# Patient Record
Sex: Female | Born: 1985
Health system: Southern US, Community
[De-identification: ages and names within clinical notes are randomized; demographics above are authoritative.]

---

## 2020-03-09 IMAGING — US US ABDOMEN LIMITED
1 series · 14 of 25 positions shown · non-contrast
Comparison: No pertinent prior exams available for comparison.

CLINICAL DATA: Abdominal pain, right upper quadrant. Additional
history provided by scanning technologist: Patient reports right
upper quadrant abdominal pain for 4 months.

EXAM:
ULTRASOUND ABDOMEN LIMITED RIGHT UPPER QUADRANT

[Series 1: us abdomen limited · 0.25mm/px · 14 of 46 slices shown]
[im 1/46]
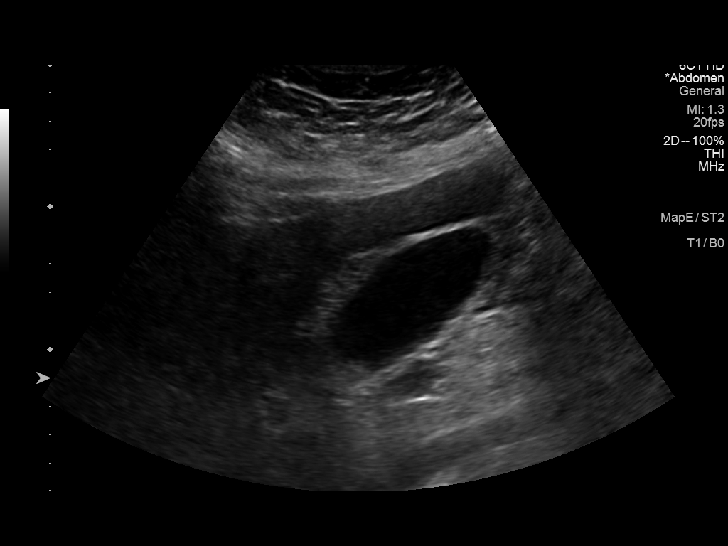
[im 4/46]
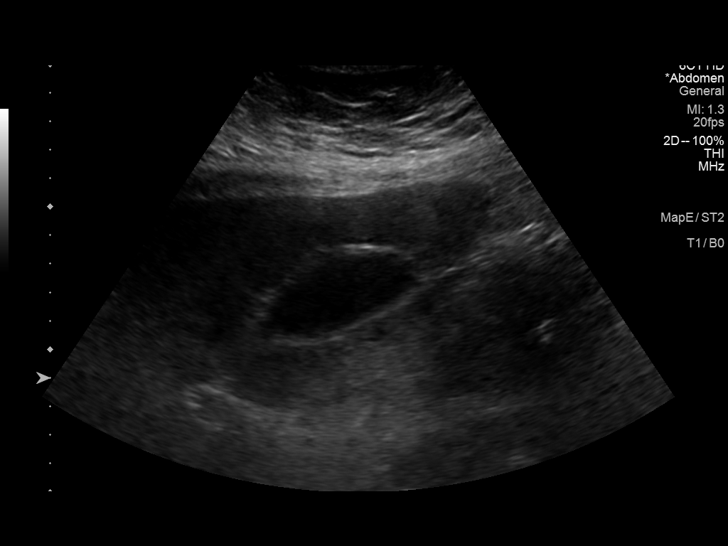
[im 8/46]
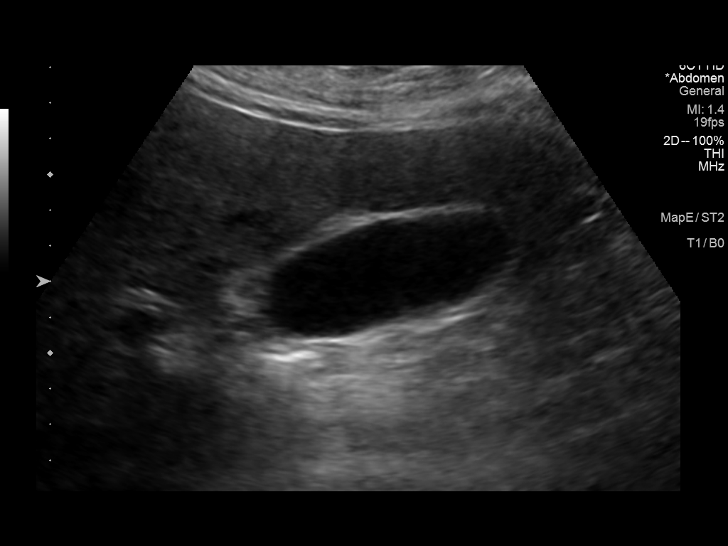
[im 12/46]
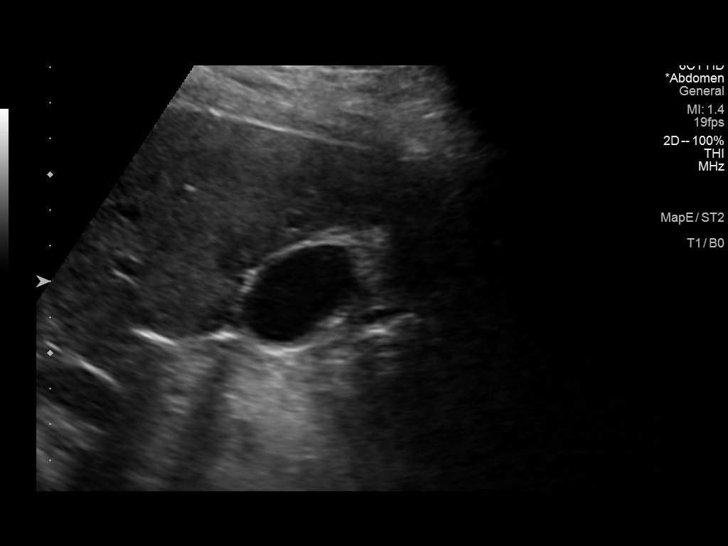
[im 16/46]
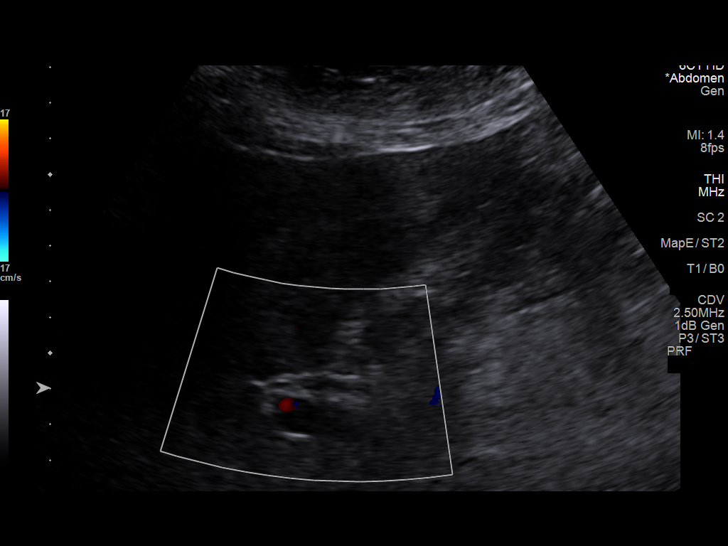
[im 17/46]
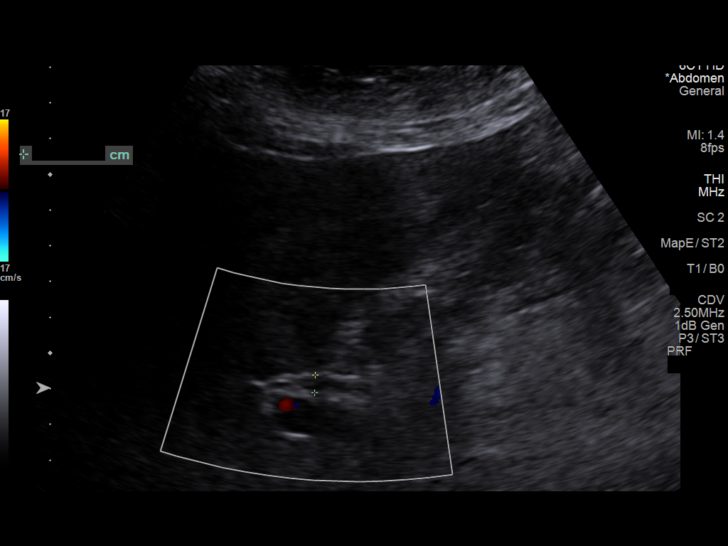
[im 21/46]
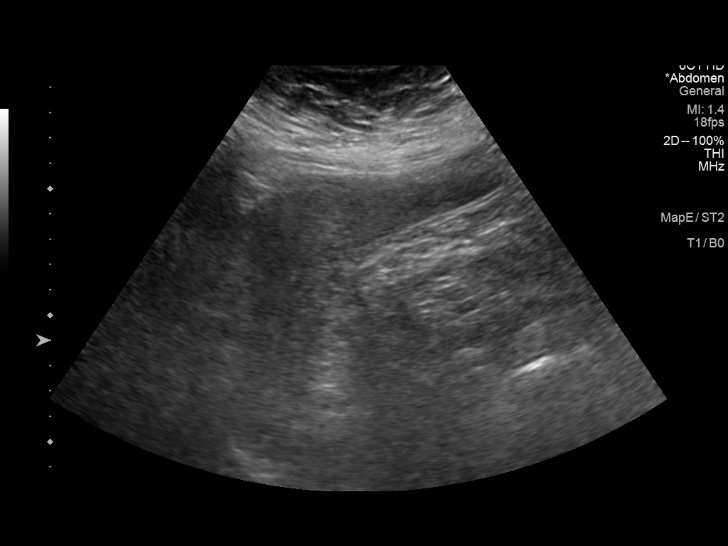
[im 25/46]
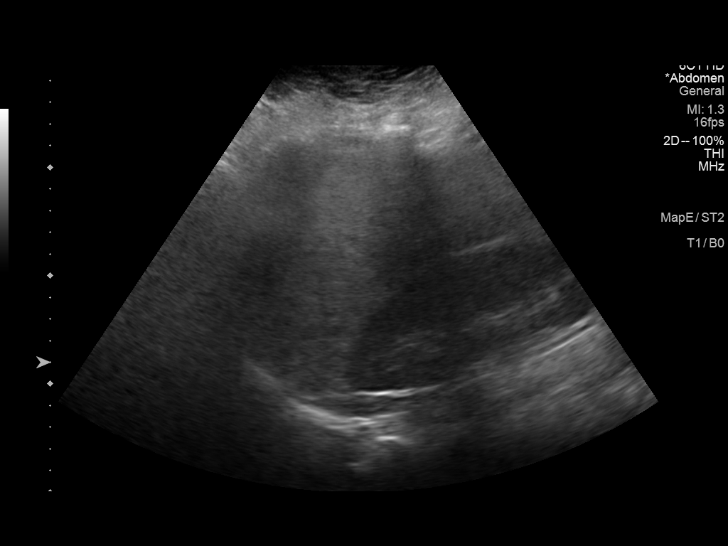
[im 29/46]
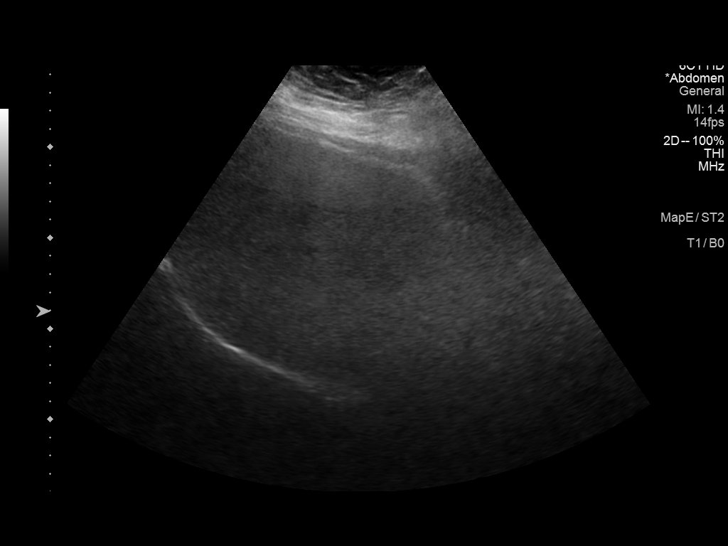
[im 31/46]
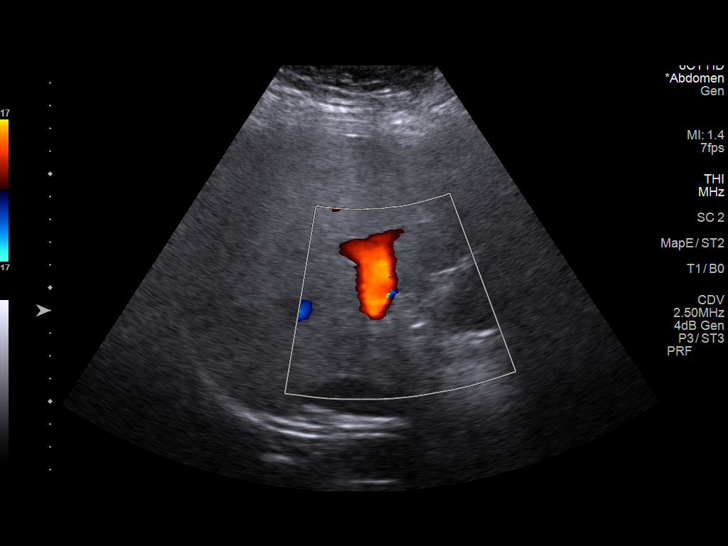
[im 34/46]
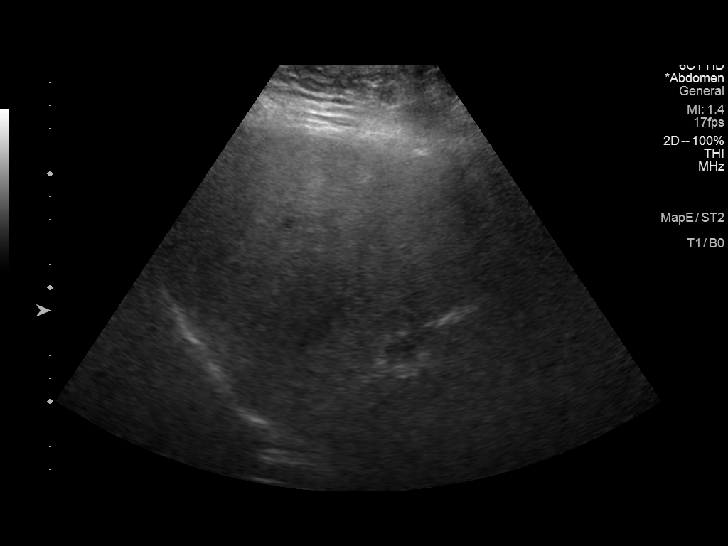
[im 38/46]
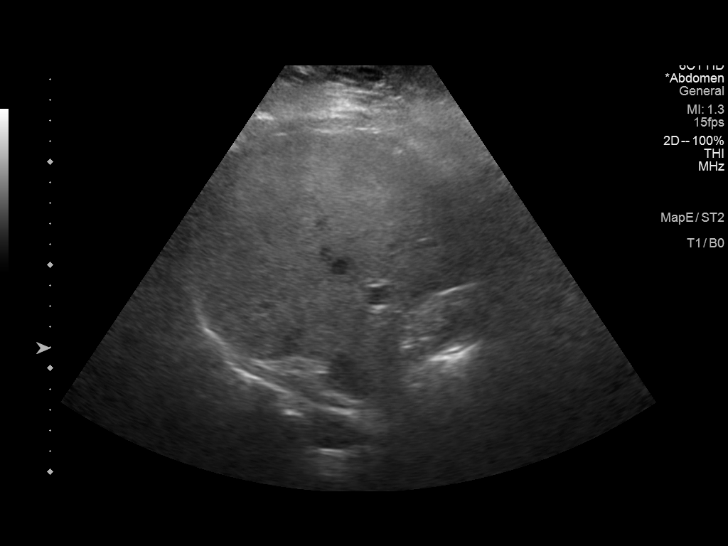
[im 42/46]
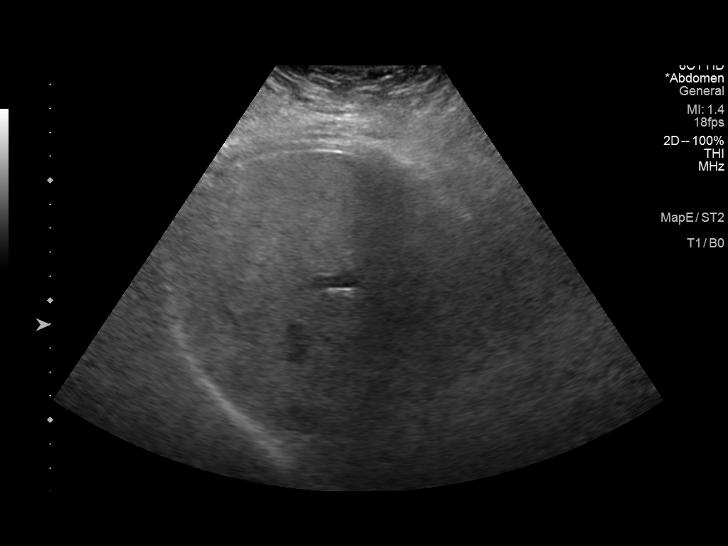
[im 46/46]
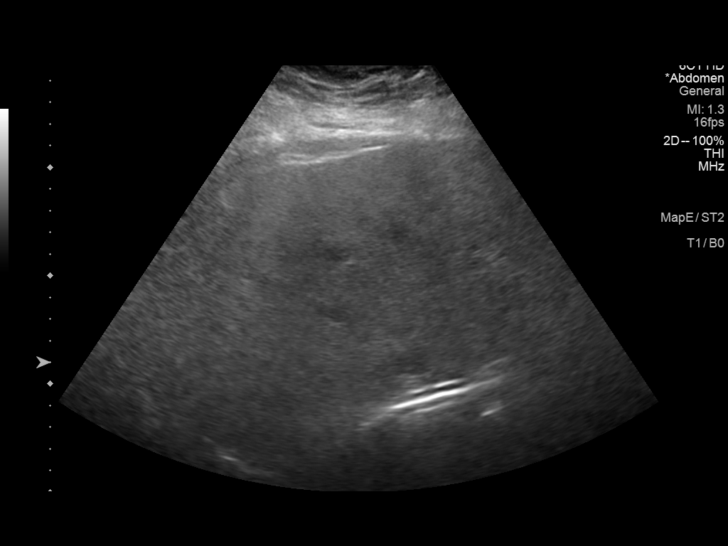

[14 of 25 positions shown; findings below may reference images not displayed]

FINDINGS: Gallbladder:

No gallstones or wall thickening visualized. No sonographic Murphy
sign noted by sonographer.

Common bile duct:

Diameter: 5 mm, within normal limits.

Liver:

No focal lesion identified. Increased hepatic parenchymal
echogenicity. Portal vein is patent on color Doppler imaging with
normal direction of blood flow towards the liver.
IMPRESSION: Hyperechogenicity of the hepatic parenchyma. This is a nonspecific
finding, which may be seen in the setting of hepatic steatosis or
other chronic hepatic parenchymal disease.

Otherwise unremarkable right upper quadrant ultrasound, as
described.

## 2020-03-17 IMAGING — CT CT ABDOMEN W/ CM
2 of 4 series · 16 of 46 positions shown, 18 images · IV contrast (omnipaque)
Comparison: None.

CLINICAL DATA: Six-month history mid to right upper quadrant
abdominal pain.

EXAM:
CT ABDOMEN WITH CONTRAST
TECHNIQUE: Multidetector CT imaging of the abdomen was performed using the
standard protocol following bolus administration of intravenous
contrast.
CONTRAST:  100mL OMNIPAQUE IOHEXOL 300 MG/ML  SOLN

[Series 2: axial st · axial · 0.77mm/px · z∈[-316,-46]mm · 13 of 60 slices shown, 15 images]
[im 3/60  soft-tissue]
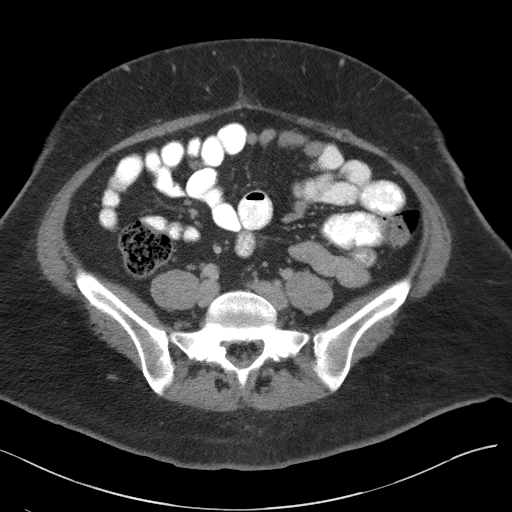
[im 3/60  bone]
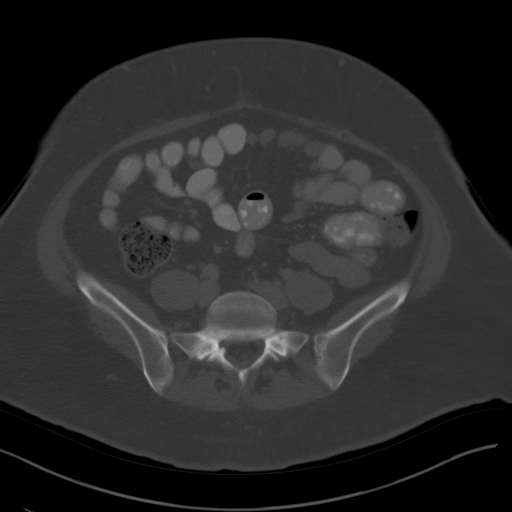
[im 9/60  soft-tissue]
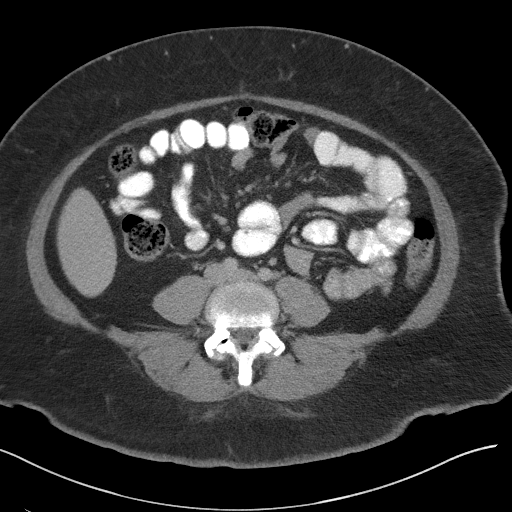
[im 12/60  soft-tissue]
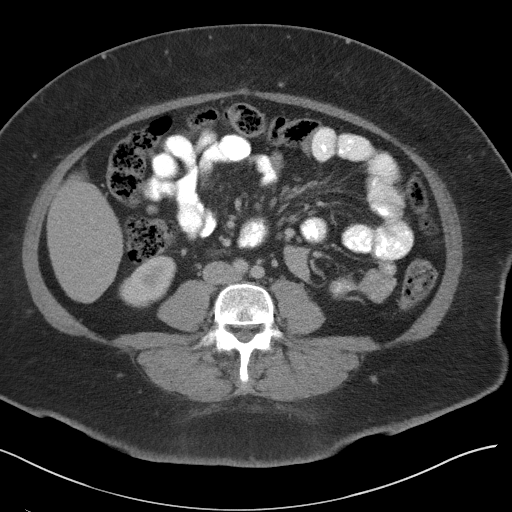
[im 17/60  soft-tissue]
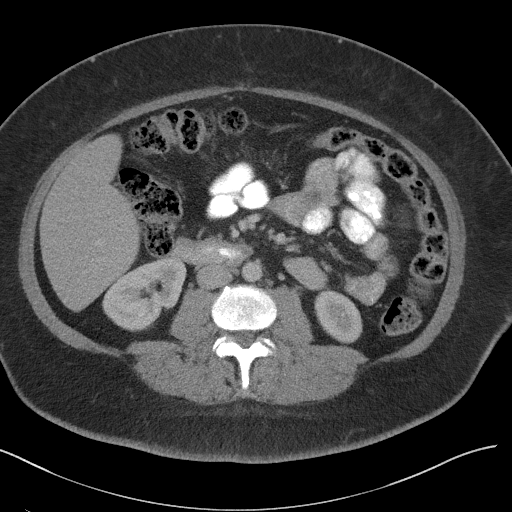
[im 20/60  soft-tissue]
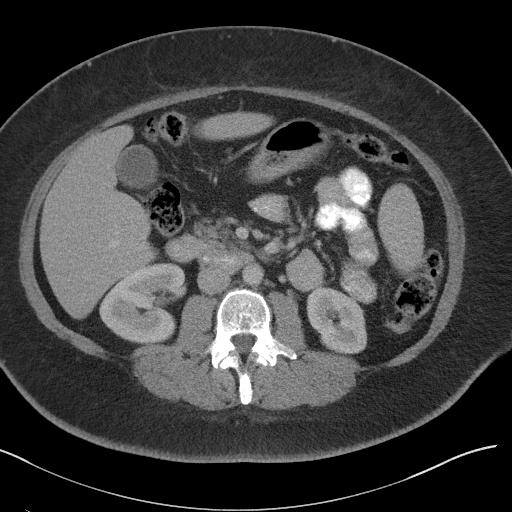
[im 26/60  soft-tissue]
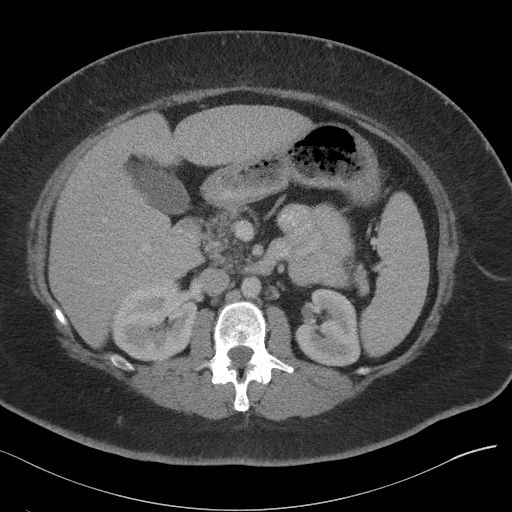
[im 31/60  soft-tissue]
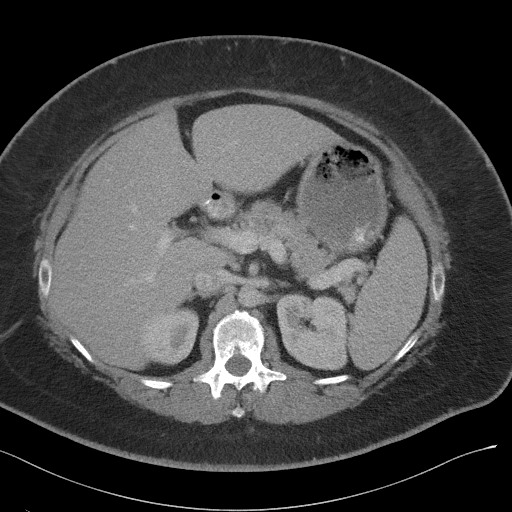
[im 34/60  soft-tissue]
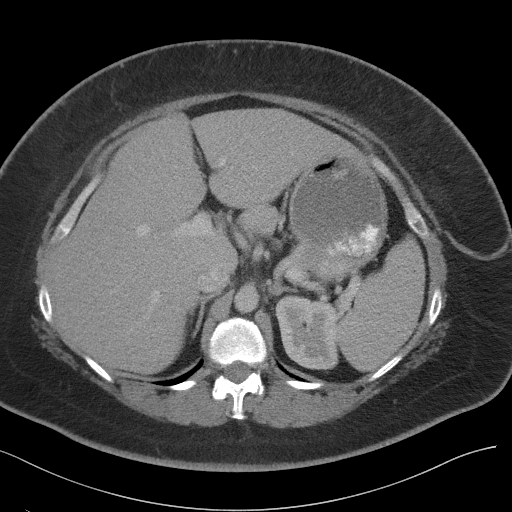
[im 40/60  soft-tissue]
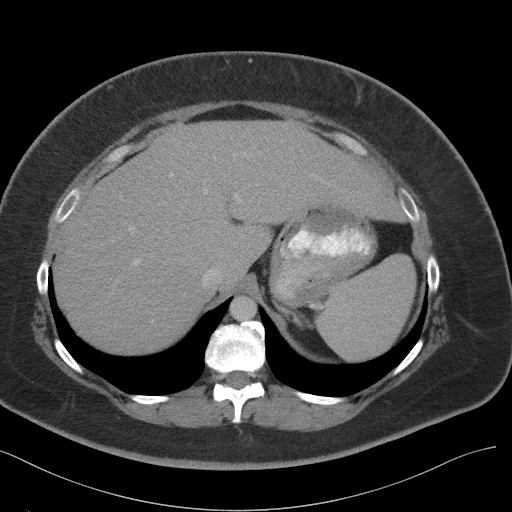
[im 40/60  bone]
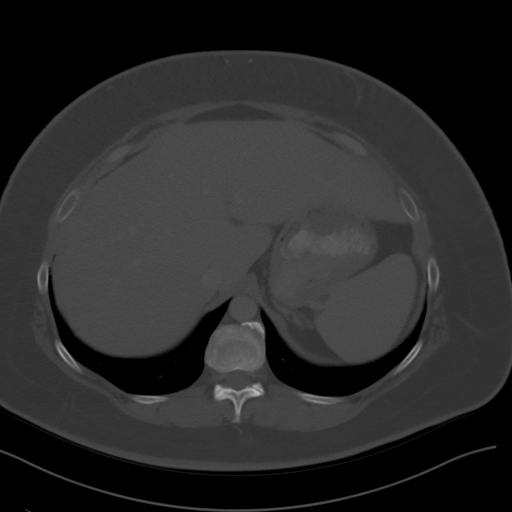
[im 43/60  soft-tissue]
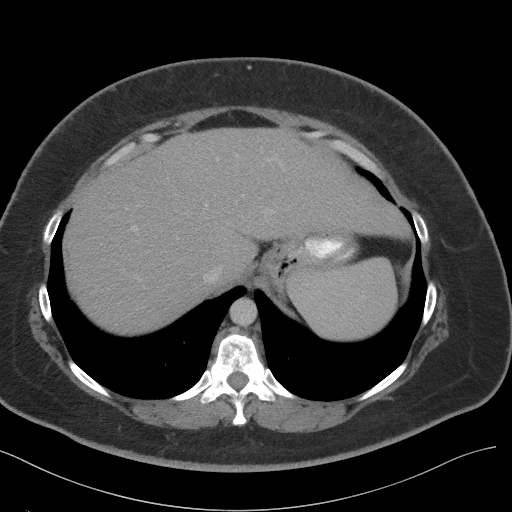
[im 48/60  soft-tissue]
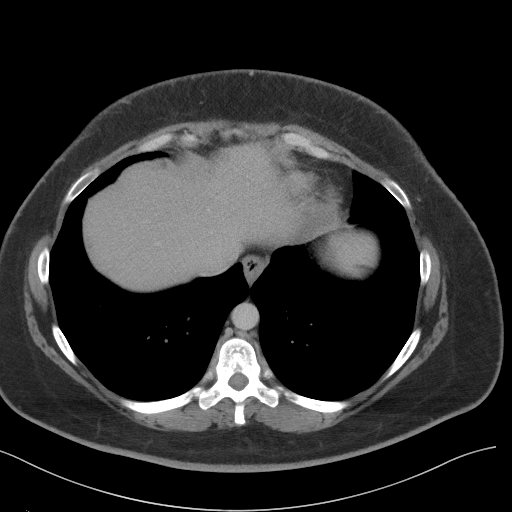
[im 51/60  soft-tissue]
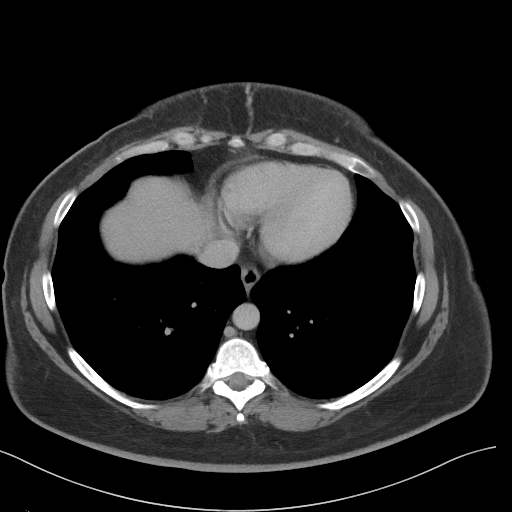
[im 57/60  soft-tissue]
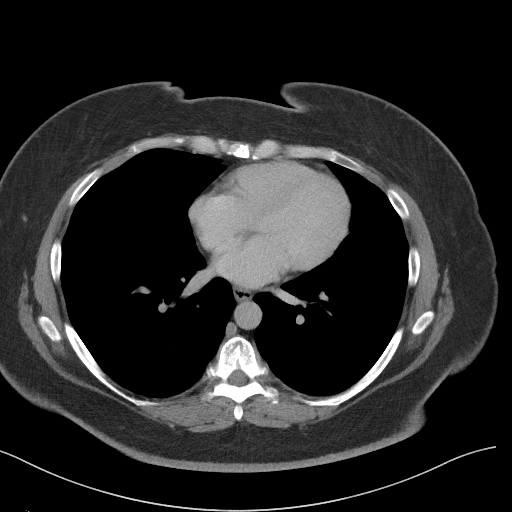

[Series 5: coronal st · coronal · 0.62mm/px · 3 of 109 slices shown]
[im 37/109  soft-tissue]
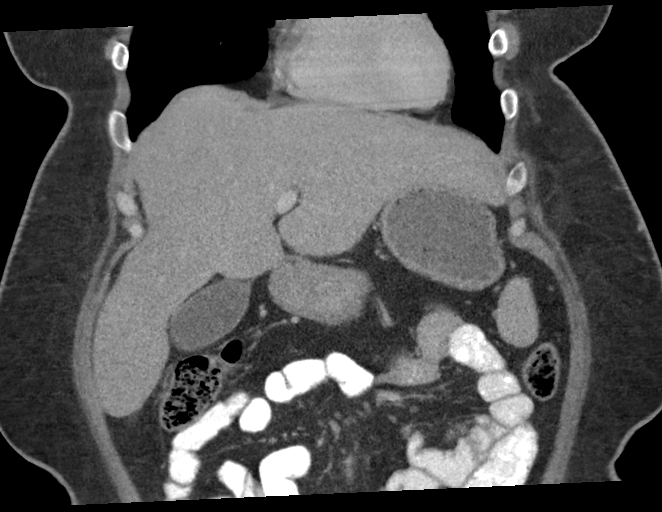
[im 49/109  soft-tissue]
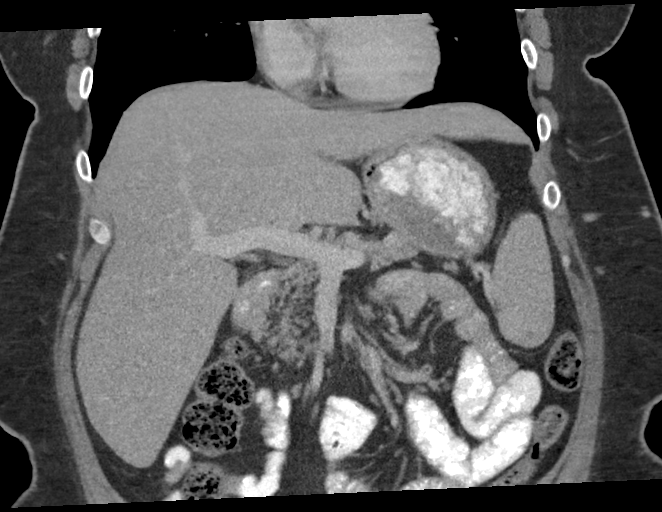
[im 61/109  soft-tissue]
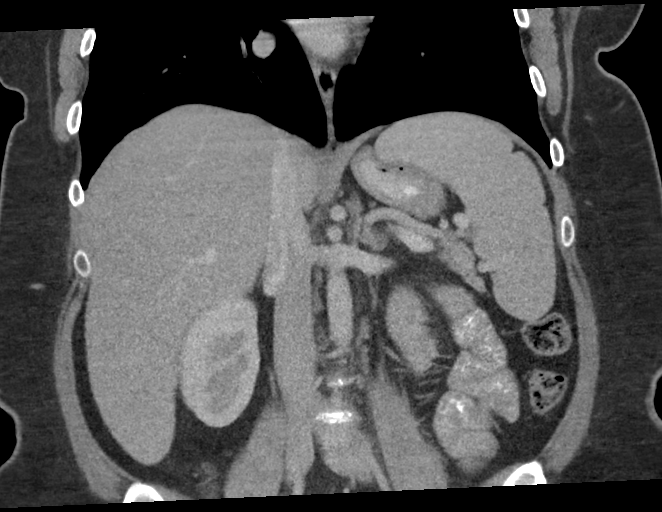

[16 of 46 positions shown; findings below may reference images not displayed]

FINDINGS: Lower chest: The lung bases are clear of acute process. No pleural
effusion or pulmonary lesions. The heart is normal in size. No
pericardial effusion. The distal esophagus and aorta are
unremarkable.

Hepatobiliary: No hepatic lesions or intrahepatic biliary
dilatation. The liver is mildly enlarged measuring 26.5 x 24.0 cm.

The gallbladder is unremarkable.  No common bile duct dilatation.

Pancreas: No mass, inflammation or ductal dilatation.

Spleen: Mild splenomegaly. Spleen measures 13 x 12.5 x 8.5 cm. No
splenic lesions.

Adrenals/Urinary Tract: The adrenal glands and kidneys are
unremarkable. No renal lesions or hydronephrosis. No renal calculi.

Stomach/Bowel: The stomach, duodenum, small bowel and colon are
unremarkable.

Vascular/Lymphatic: The aorta is normal in caliber. No dissection.
The branch vessels are patent. The major venous structures are
patent. No mesenteric or retroperitoneal mass or adenopathy. Small
scattered lymph nodes are noted.

Other: No ascites or abdominal wall hernia.

Musculoskeletal: No significant bony findings.
IMPRESSION: 1. Mild hepatosplenomegaly.
2. No acute abdominal findings, mass lesions or adenopathy.

## 2021-01-17 IMAGING — MR MR FOOT*L* W/O CM
4 of 5 series · 17 of 40 positions shown · non-contrast
Comparison: X-ray [DATE]

CLINICAL DATA: Foot pain, chronic, osteoarthritis suspected Charcot
Arthropathy second MTP joint malalignment

EXAM:
MRI OF THE LEFT FOOT WITHOUT CONTRAST
TECHNIQUE: Multiplanar, multisequence MR imaging of the left forefoot was
performed. No intravenous contrast was administered.

[Series 4: T1 · coronal · 3.0mm · 0.19mm/px · 3 of 44 slices shown (1 of 2)]
[im 5/44]
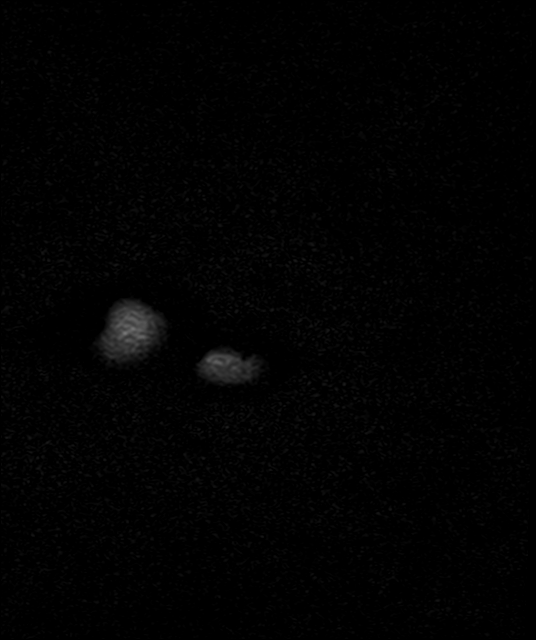
[im 22/44]
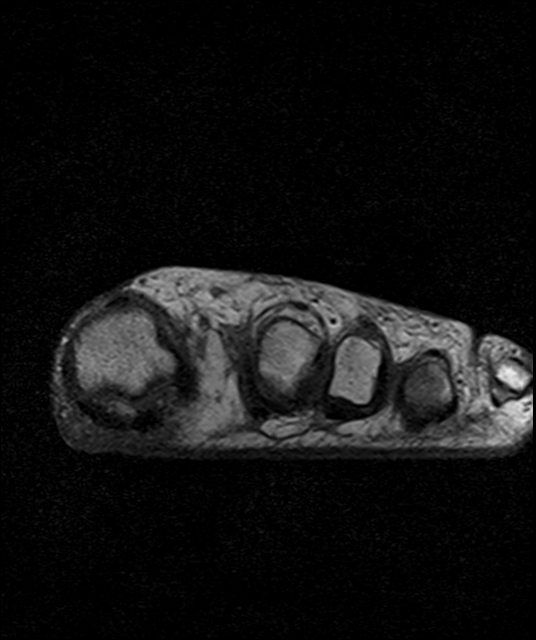
[im 39/44]
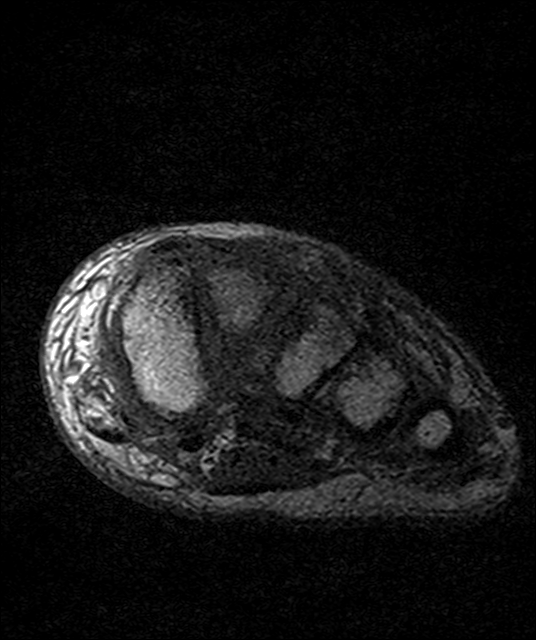

[Series 5: T2 fat-sat · coronal · 3.0mm · 0.19mm/px · 8 of 44 slices shown (1 of 2)]
[im 1/44]
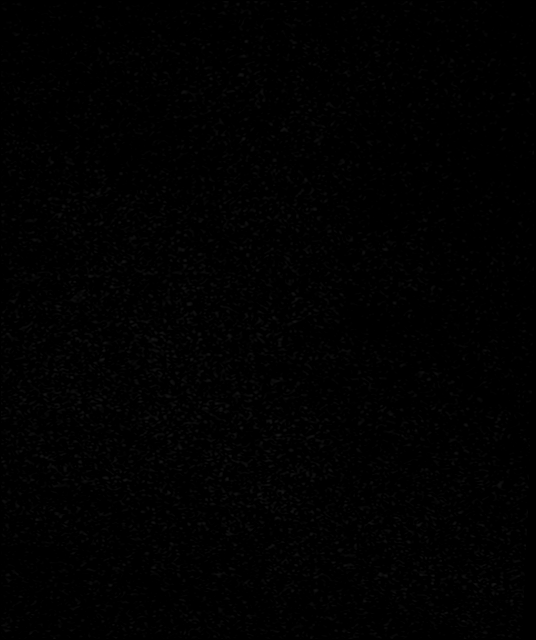
[im 5/44]
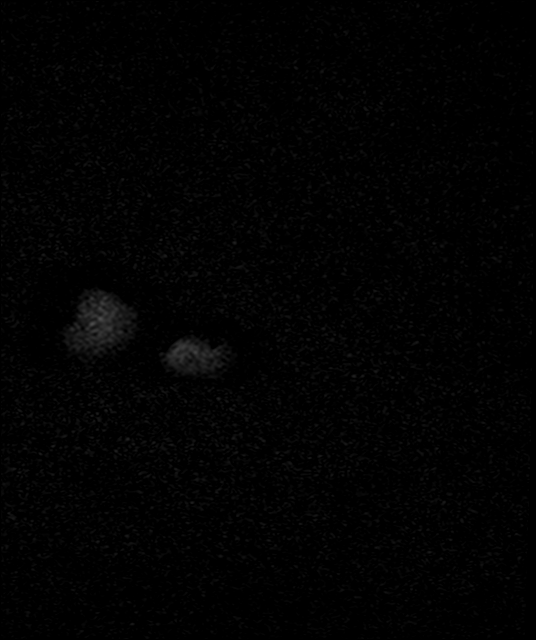
[im 9/44]
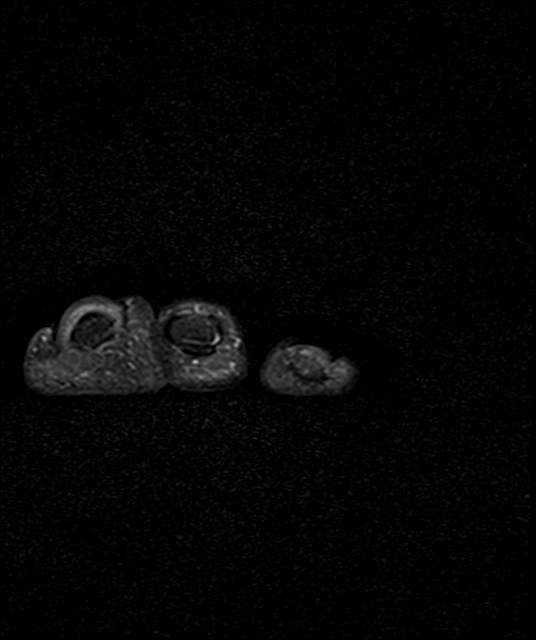
[im 13/44]
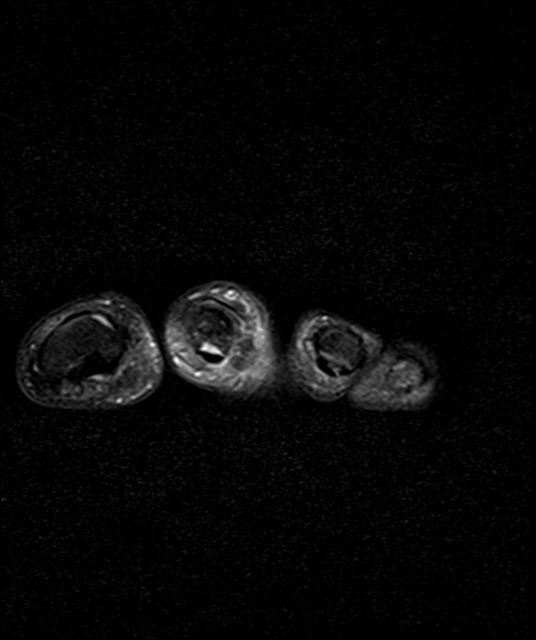
[im 18/44]
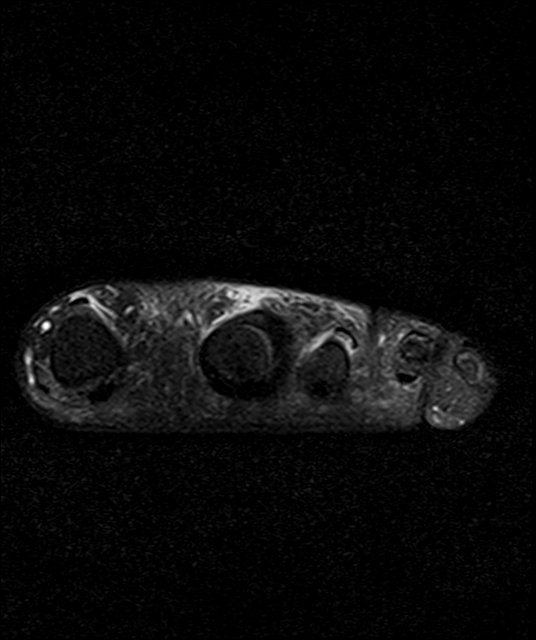
[im 22/44]
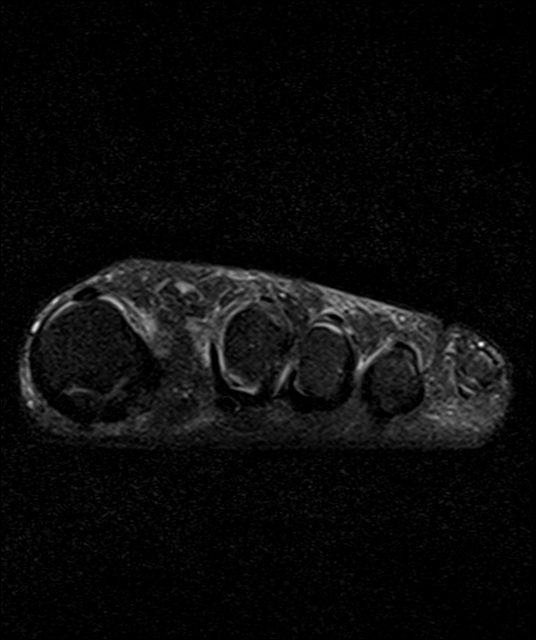
[im 26/44]
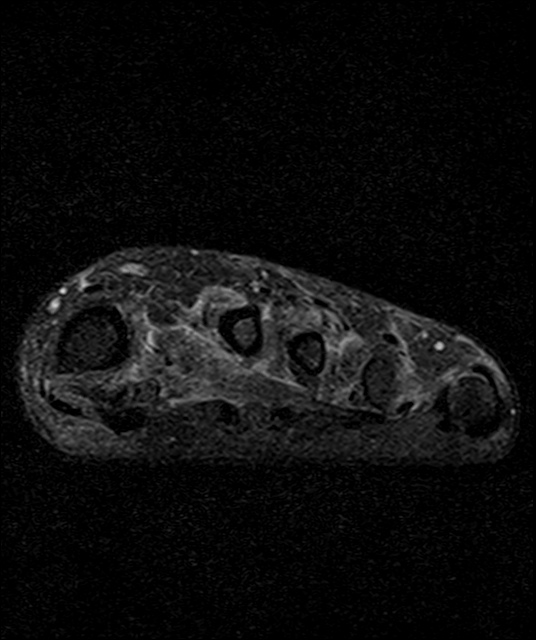
[im 39/44]
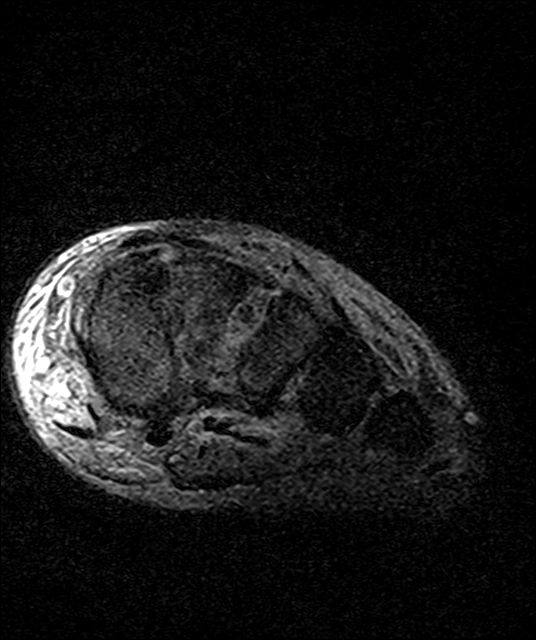

[Series 6: T2 fat-sat · axial · 3.0mm · 0.35mm/px · z∈[-167,-88]mm · 3 of 22 slices shown (2 of 2)]
[im 1/22]
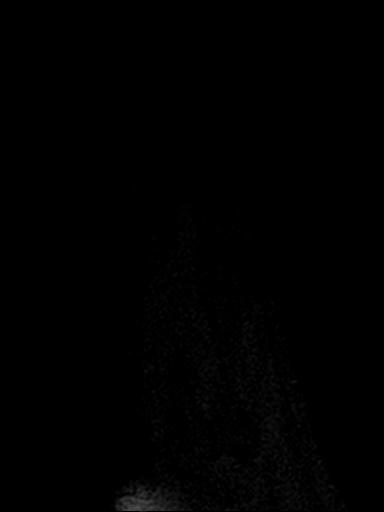
[im 11/22]
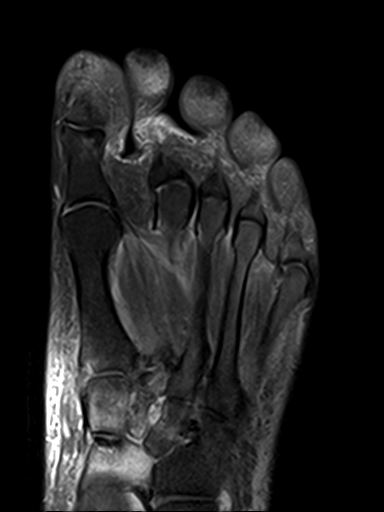
[im 22/22]
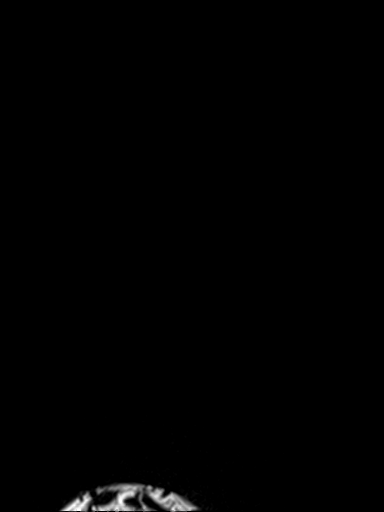

[Series 7: T1 · axial · 3.0mm · 0.35mm/px · z∈[-167,-88]mm · 3 of 22 slices shown (2 of 2)]
[im 1/22]
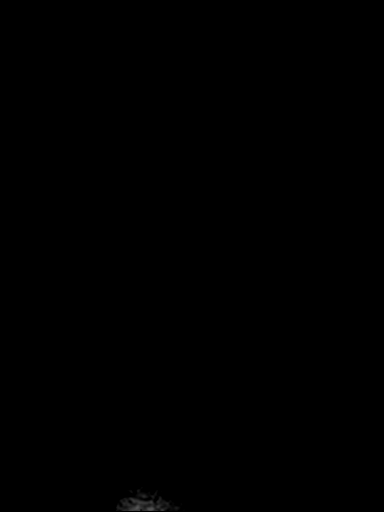
[im 11/22]
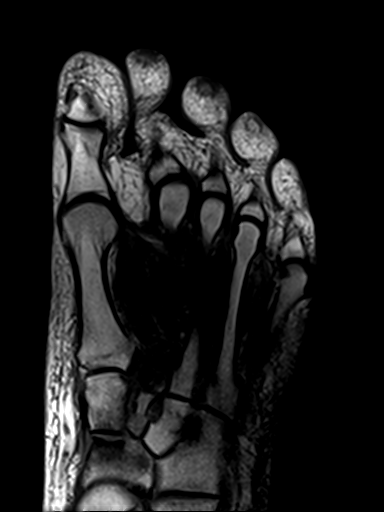
[im 22/22]
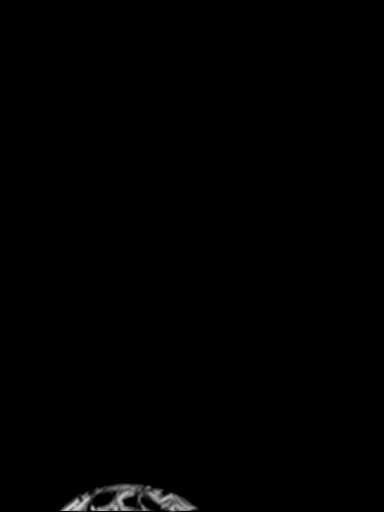

[17 of 40 positions shown; findings below may reference images not displayed]

FINDINGS: Bones/Joint/Cartilage

Nondisplaced fracture involving the plantar aspect of the second
metatarsal base with surrounding bone marrow edema (series 8, image
14). Suspected tiny cortical avulsion fracture involving the medial
cuneiform at the Lisfranc ligament attachment site. Additional
fracture suspected involving the plantar aspect of the intermediate
cuneiform. Slight widening of the Lisfranc interval. Possible tiny
avulsion fracture of the first metatarsal base. Bone marrow edema
with subchondral fracture involving the distal aspect of the
navicular. Medial subluxation at the second MTP joint without
fracture.

Ligaments

Complete tear of the Lisfranc ligament with slight widening of the
Lisfranc interval. Collateral ligaments of the MTP joints appear
intact including the lateral collateral ligament of the second MTP
joint.

Muscles and Tendons

Mildly increased T2 signal throughout the intrinsic foot musculature
which may be posttraumatic or related to denervation. No muscle
atrophy. Intact flexor and extensor tendons.

Soft tissues

Mild diffuse soft tissue edema. No organized fluid collection. No
ulceration.
IMPRESSION: 1. Lisfranc ligament disruption with slight widening of the Lisfranc
interval, likely acute.
2. Acute nondisplaced fractures centered at the first and second TMT
joints.
3. Subchondral fracture involving the distal aspect of the
navicular.
4. Medial subluxation at the second MTP joint without fracture or
ligamentous disruption.

These results will be called to the ordering clinician or
representative by the Radiologist Assistant, and communication
documented in the PACS or [REDACTED].

## 2021-01-17 IMAGING — MR MR ANKLE*L* W/O CM
5 series · 37 of 40 positions shown · non-contrast
Comparison: X-ray [DATE]

CLINICAL DATA: Ankle pain, chronic, osteoarthritis suspected
Charcot arthropathy

EXAM:
MRI OF THE LEFT ANKLE WITHOUT CONTRAST
TECHNIQUE: Multiplanar, multisequence MR imaging of the ankle was performed. No
intravenous contrast was administered.

[Series 4: T2 fat-sat · axial · 3.0mm · 0.50mm/px · z∈[-144,-23]mm · 8 of 32 slices shown (1 of 2)]
[im 1/32]
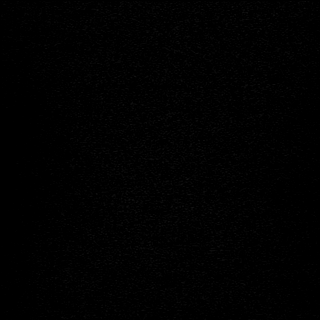
[im 5/32]
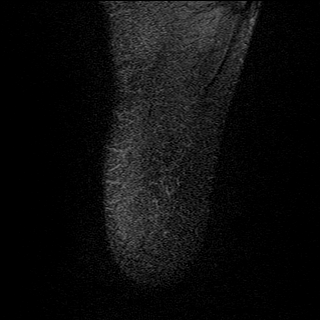
[im 9/32]
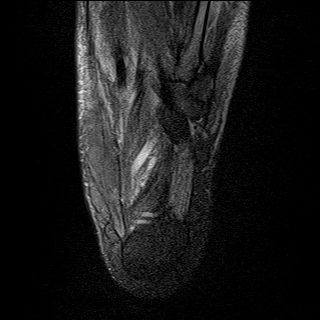
[im 14/32]
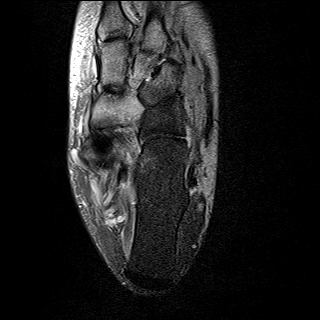
[im 18/32]
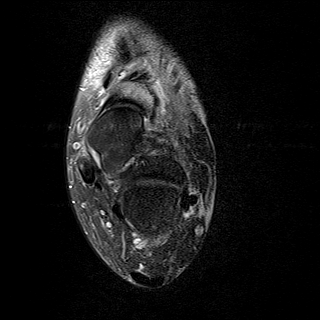
[im 23/32]
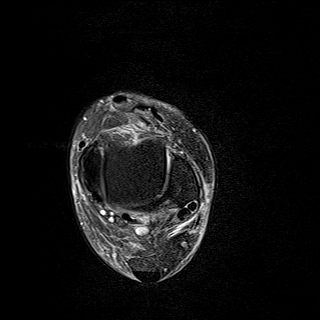
[im 27/32]
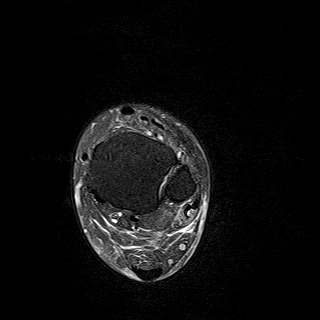
[im 32/32]
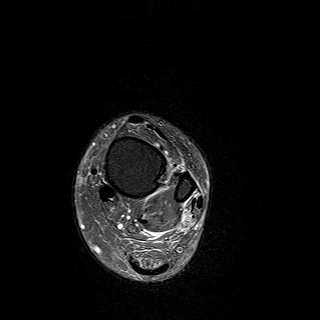

[Series 5: PD fat-sat · axial · 3.0mm · 0.42mm/px · z∈[-144,-23]mm · 9 of 32 slices shown]
[im 1/32]
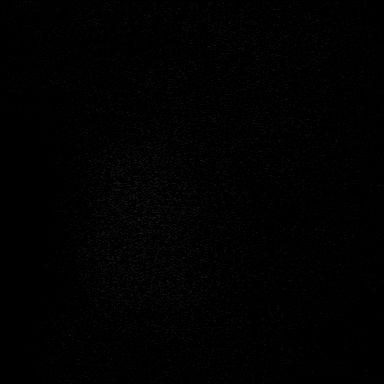
[im 4/32]
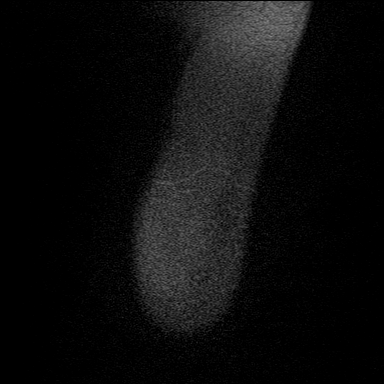
[im 8/32]
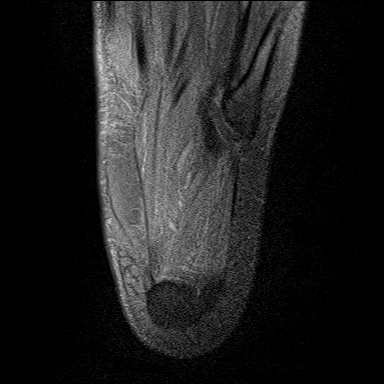
[im 12/32]
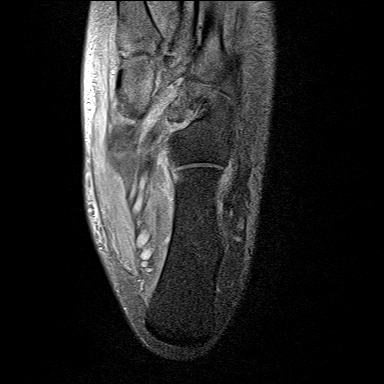
[im 16/32]
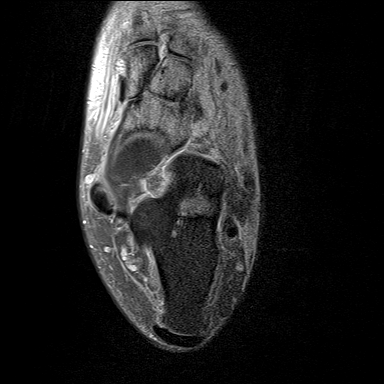
[im 20/32]
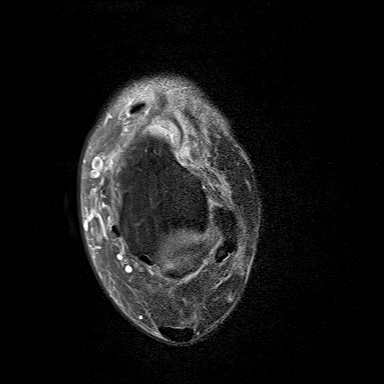
[im 24/32]
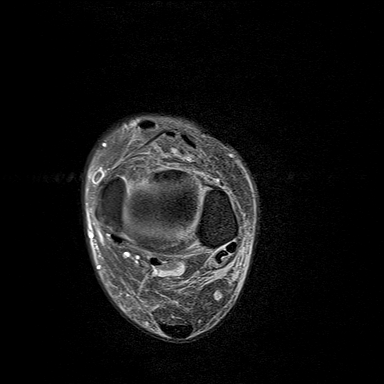
[im 28/32]
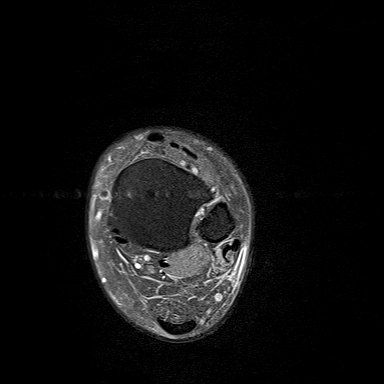
[im 32/32]
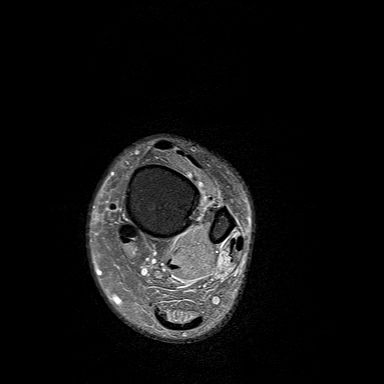

[Series 6: T1 · sagittal · 4.0mm · 0.56mm/px · 6 of 20 slices shown]
[im 1/20]
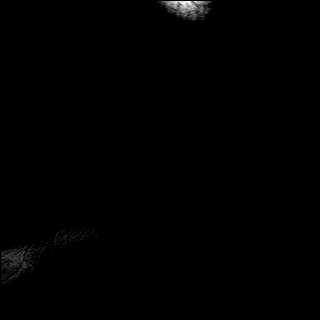
[im 4/20]
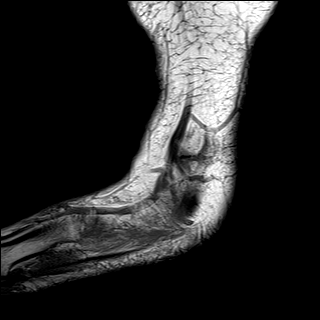
[im 8/20]
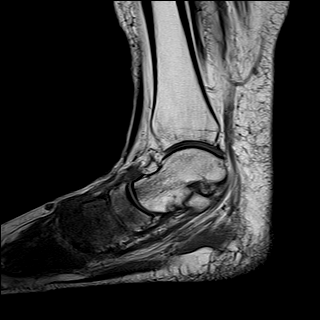
[im 12/20]
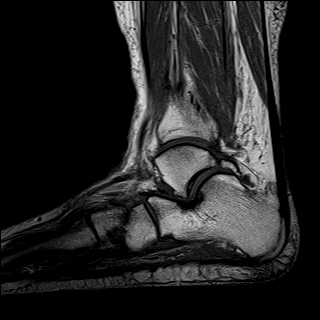
[im 16/20]
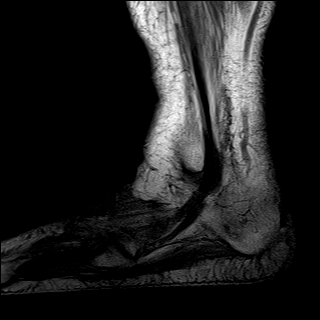
[im 20/20]
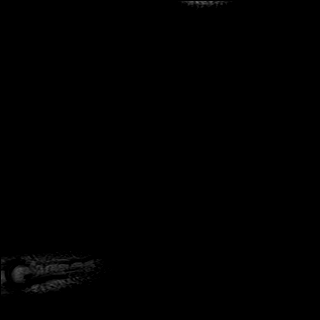

[Series 7: STIR · sagittal · 4.0mm · 0.35mm/px · 3 of 20 slices shown]
[im 1/20]
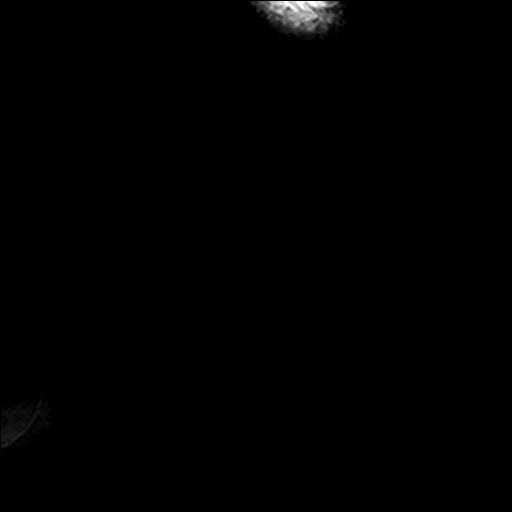
[im 4/20]
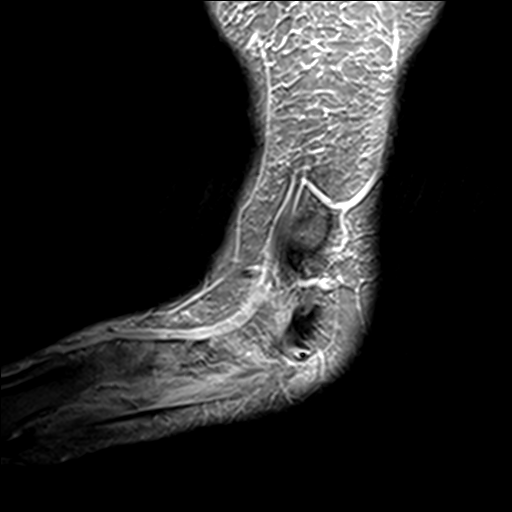
[im 8/20]
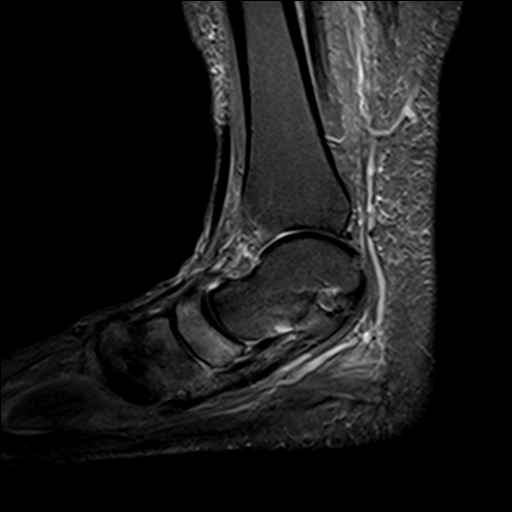

[Series 8: T2 fat-sat · coronal · 3.0mm · 0.50mm/px · 11 of 38 slices shown (2 of 2)]
[im 1/38]
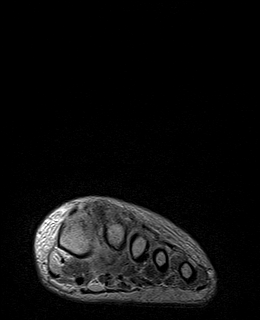
[im 4/38]
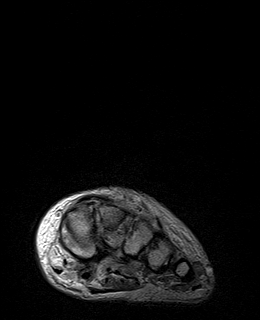
[im 8/38]
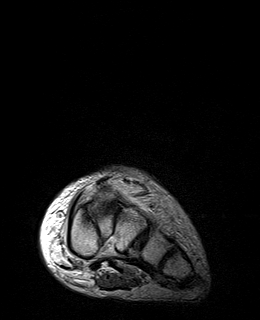
[im 12/38]
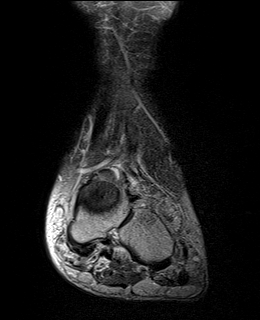
[im 15/38]
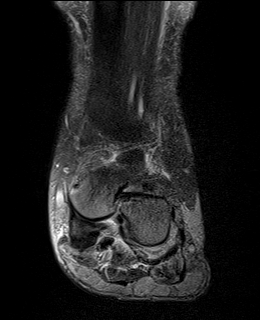
[im 19/38]
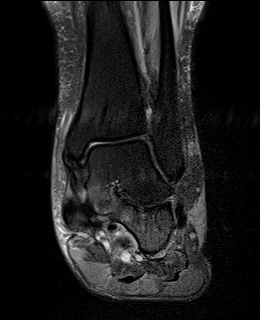
[im 23/38]
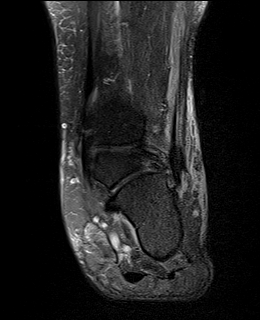
[im 26/38]
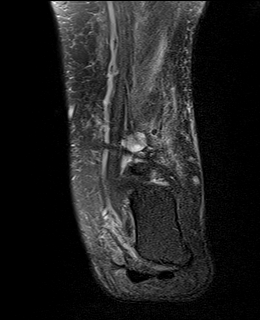
[im 30/38]
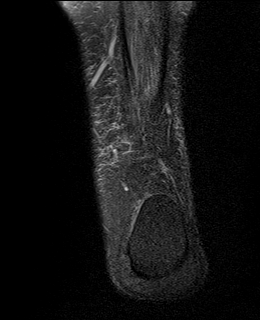
[im 34/38]
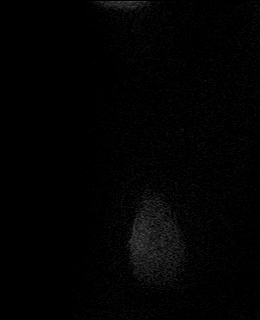
[im 38/38]
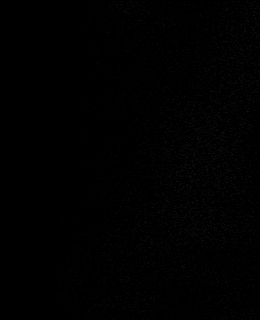

[37 of 40 positions shown; findings below may reference images not displayed]

FINDINGS: TENDONS

Peroneal: Peroneus longus and brevis tendons are intact and normally
positioned.

Posteromedial: Severe tendinosis of the distal tibialis posterior
tendon with high-grade complete or near complete tear of the distal
tendon at the level of the talar neck (series 5, images 14-16).
Flexor hallucis longus and flexor digitorum longus tendons intact.

Anterior: Tibialis anterior, extensor hallucis longus, and extensor
digitorum longus tendons are intact and normally positioned.

Achilles: Intact.

Plantar Fascia: Intact.

LIGAMENTS

Lateral: Intact tibiofibular ligaments. The anterior and posterior
talofibular ligaments are intact. Intact calcaneofibular ligament.

Medial: Intact deltoid ligament. Superomedial aspect of the spring
ligament complex is somewhat ill-defined and may be partially torn.

CARTILAGE AND BONES

Ankle Joint: No significant ankle joint effusion. The talar dome and
tibial plafond are intact.

Subtalar Joints/Sinus Tarsi: No cartilage defect. No effusion.
Preservation of the anatomic fat within the sinus tarsi.

Bones: Bone marrow edema within the midfoot most predominantly
involving the navicular bone, medial and intermediate cuneiform
bones as well as the second metatarsal base. High-grade cartilage
loss at the naviculocuneiform joint. Subchondral fracture of the
distal navicular bone (series 4, image 18). Pes planovalgus
alignment. No dislocation.

Other: Mild subcutaneous edema. No fluid collection. No ulceration.
IMPRESSION: 1. Severe tendinosis of the distal tibialis posterior tendon with
high-grade complete or near-complete tear of the distal tendon at
the level of the talar neck.
2. Bone marrow edema within the midfoot most predominantly involving
the navicular bone, medial and intermediate cuneiform bones as well
as the second metatarsal base. Subchondral fracture of the distal
navicular bone. Findings may be degenerative, stress-related, or
reflect developing neuropathic/Charcot arthropathy.
3. Pes planovalgus alignment.
